# Patient Record
Sex: Male | Born: 1990 | Race: Black or African American | Hispanic: No | Marital: Married | Smoking: Never smoker
Health system: Southern US, Community
[De-identification: ages and names within clinical notes are randomized; demographics above are authoritative.]

## PROBLEM LIST (undated history)

## (undated) DIAGNOSIS — Z6841 Body Mass Index (BMI) 40.0 and over, adult: Secondary | ICD-10-CM

---
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
## 2013-01-11 HISTORY — PX: IRRIGATION AND DEBRIDEMENT ABDOMEN: SHX6600

## 2014-07-09 ENCOUNTER — Other Ambulatory Visit: Payer: Self-pay | Admitting: Orthopedic Surgery

## 2014-07-09 DIAGNOSIS — M542 Cervicalgia: Secondary | ICD-10-CM

## 2014-07-09 DIAGNOSIS — M25511 Pain in right shoulder: Secondary | ICD-10-CM

## 2014-07-25 ENCOUNTER — Ambulatory Visit
Admission: RE | Admit: 2014-07-25 | Discharge: 2014-07-25 | Disposition: A | Discharge status: Home / Self Care | Payer: BLUE CROSS/BLUE SHIELD | Source: Ambulatory Visit | Attending: Orthopedic Surgery | Admitting: Orthopedic Surgery

## 2014-07-25 DIAGNOSIS — M25511 Pain in right shoulder: Secondary | ICD-10-CM

## 2014-07-25 DIAGNOSIS — M542 Cervicalgia: Secondary | ICD-10-CM

## 2014-11-06 ENCOUNTER — Encounter (HOSPITAL_BASED_OUTPATIENT_CLINIC_OR_DEPARTMENT_OTHER): Payer: Self-pay | Admitting: *Deleted

## 2014-11-11 ENCOUNTER — Other Ambulatory Visit: Payer: Self-pay | Admitting: Orthopedic Surgery

## 2014-11-13 ENCOUNTER — Ambulatory Visit (HOSPITAL_BASED_OUTPATIENT_CLINIC_OR_DEPARTMENT_OTHER): Payer: BLUE CROSS/BLUE SHIELD | Admitting: Anesthesiology

## 2014-11-13 ENCOUNTER — Encounter (HOSPITAL_BASED_OUTPATIENT_CLINIC_OR_DEPARTMENT_OTHER): Payer: Self-pay | Admitting: Anesthesiology

## 2014-11-13 ENCOUNTER — Ambulatory Visit (HOSPITAL_BASED_OUTPATIENT_CLINIC_OR_DEPARTMENT_OTHER)
Admission: RE | Admit: 2014-11-13 | Discharge: 2014-11-13 | Disposition: A | Discharge status: Home / Self Care | Payer: BLUE CROSS/BLUE SHIELD | Source: Ambulatory Visit | Attending: Orthopedic Surgery | Admitting: Orthopedic Surgery

## 2014-11-13 ENCOUNTER — Encounter (HOSPITAL_BASED_OUTPATIENT_CLINIC_OR_DEPARTMENT_OTHER)
Admission: RE | Disposition: A | Discharge status: Home / Self Care | Payer: Self-pay | Source: Ambulatory Visit | Attending: Orthopedic Surgery

## 2014-11-13 DIAGNOSIS — S43491A Other sprain of right shoulder joint, initial encounter: Secondary | ICD-10-CM | POA: Insufficient documentation

## 2014-11-13 DIAGNOSIS — M7541 Impingement syndrome of right shoulder: Secondary | ICD-10-CM | POA: Diagnosis not present

## 2014-11-13 DIAGNOSIS — X58XXXA Exposure to other specified factors, initial encounter: Secondary | ICD-10-CM | POA: Insufficient documentation

## 2014-11-13 HISTORY — DX: Body Mass Index (BMI) 40.0 and over, adult: Z684

## 2014-11-13 HISTORY — PX: SHOULDER ARTHROSCOPY WITH OPEN ROTATOR CUFF REPAIR AND DISTAL CLAVICLE ACROMINECTOMY: SHX5683

## 2014-11-13 SURGERY — SHOULDER ARTHROSCOPY WITH OPEN ROTATOR CUFF REPAIR AND DISTAL CLAVICLE ACROMINECTOMY
Anesthesia: Regional | Site: Shoulder | Laterality: Right

## 2014-11-13 MED ORDER — SUCCINYLCHOLINE CHLORIDE 20 MG/ML IJ SOLN
INTRAMUSCULAR | Status: DC | PRN
  Administered 2014-11-13: 140 mg via INTRAVENOUS

## 2014-11-13 MED ORDER — HYDROMORPHONE HCL 1 MG/ML IJ SOLN: 0.2500 mg | INTRAMUSCULAR | Status: DC | PRN

## 2014-11-13 MED ORDER — GLYCOPYRROLATE 0.2 MG/ML IJ SOLN: 0.2000 mg | Freq: Once | INTRAMUSCULAR | Status: DC | PRN

## 2014-11-13 MED ORDER — MEPERIDINE HCL 25 MG/ML IJ SOLN: 6.2500 mg | INTRAMUSCULAR | Status: DC | PRN

## 2014-11-13 MED ORDER — PROPOFOL 10 MG/ML IV BOLUS
INTRAVENOUS | Status: DC | PRN
  Administered 2014-11-13: 200 mg via INTRAVENOUS

## 2014-11-13 MED ORDER — ARTIFICIAL TEARS OP OINT
TOPICAL_OINTMENT | OPHTHALMIC | Status: DC | PRN
  Administered 2014-11-13: 1 via OPHTHALMIC

## 2014-11-13 MED ORDER — FENTANYL CITRATE (PF) 100 MCG/2ML IJ SOLN
INTRAMUSCULAR | Status: AC
  Filled 2014-11-13: qty 4

## 2014-11-13 MED ORDER — MIDAZOLAM HCL 2 MG/2ML IJ SOLN
INTRAMUSCULAR | Status: AC
  Filled 2014-11-13: qty 2

## 2014-11-13 MED ORDER — PROMETHAZINE HCL 25 MG/ML IJ SOLN
6.2500 mg | INTRAMUSCULAR | Status: DC | PRN
Start: 2014-11-13 — End: 2014-11-13

## 2014-11-13 MED ORDER — LACTATED RINGERS IV SOLN: INTRAVENOUS | Status: DC

## 2014-11-13 MED ORDER — DEXAMETHASONE SODIUM PHOSPHATE 4 MG/ML IJ SOLN
INTRAMUSCULAR | Status: DC | PRN
  Administered 2014-11-13: 10 mg via INTRAVENOUS

## 2014-11-13 MED ORDER — CHLORHEXIDINE GLUCONATE 4 % EX LIQD: 60.0000 mL | Freq: Once | CUTANEOUS | Status: DC

## 2014-11-13 MED ORDER — BUPIVACAINE-EPINEPHRINE (PF) 0.5% -1:200000 IJ SOLN
INTRAMUSCULAR | Status: DC | PRN
  Administered 2014-11-13: 20 mL via PERINEURAL

## 2014-11-13 MED ORDER — CEFAZOLIN SODIUM-DEXTROSE 2-3 GM-% IV SOLR
INTRAVENOUS | Status: AC
  Filled 2014-11-13: qty 50

## 2014-11-13 MED ORDER — FENTANYL CITRATE (PF) 100 MCG/2ML IJ SOLN
INTRAMUSCULAR | Status: AC
  Filled 2014-11-13: qty 2

## 2014-11-13 MED ORDER — PROPOFOL 10 MG/ML IV BOLUS
INTRAVENOUS | Status: AC
  Filled 2014-11-13: qty 20

## 2014-11-13 MED ORDER — PHENYLEPHRINE HCL 10 MG/ML IJ SOLN
INTRAMUSCULAR | Status: DC | PRN
  Administered 2014-11-13 (×3): 40 ug via INTRAVENOUS

## 2014-11-13 MED ORDER — LIDOCAINE HCL (CARDIAC) 20 MG/ML IV SOLN
INTRAVENOUS | Status: DC | PRN
  Administered 2014-11-13: 80 mg via INTRAVENOUS

## 2014-11-13 MED ORDER — CEFAZOLIN SODIUM 1-5 GM-% IV SOLN
INTRAVENOUS | Status: AC
  Filled 2014-11-13: qty 50

## 2014-11-13 MED ORDER — OXYCODONE-ACETAMINOPHEN 5-325 MG PO TABS: 1.0000 | ORAL_TABLET | Freq: Four times a day (QID) | ORAL | Status: AC | PRN

## 2014-11-13 MED ORDER — ONDANSETRON HCL 4 MG/2ML IJ SOLN
INTRAMUSCULAR | Status: AC
  Filled 2014-11-13: qty 2

## 2014-11-13 MED ORDER — LIDOCAINE HCL (CARDIAC) 20 MG/ML IV SOLN
INTRAVENOUS | Status: AC
  Filled 2014-11-13: qty 5

## 2014-11-13 MED ORDER — LIDOCAINE HCL 4 % MT SOLN
OROMUCOSAL | Status: DC | PRN
  Administered 2014-11-13: 2 mL via TOPICAL

## 2014-11-13 MED ORDER — PHENYLEPHRINE 40 MCG/ML (10ML) SYRINGE FOR IV PUSH (FOR BLOOD PRESSURE SUPPORT)
PREFILLED_SYRINGE | INTRAVENOUS | Status: AC
  Filled 2014-11-13: qty 10

## 2014-11-13 MED ORDER — MIDAZOLAM HCL 2 MG/2ML IJ SOLN
1.0000 mg | INTRAMUSCULAR | Status: DC | PRN
  Administered 2014-11-13: 2 mg via INTRAVENOUS

## 2014-11-13 MED ORDER — MIDAZOLAM HCL 2 MG/2ML IJ SOLN
INTRAMUSCULAR | Status: AC
  Filled 2014-11-13: qty 4

## 2014-11-13 MED ORDER — DEXAMETHASONE SODIUM PHOSPHATE 10 MG/ML IJ SOLN
INTRAMUSCULAR | Status: AC
  Filled 2014-11-13: qty 1

## 2014-11-13 MED ORDER — FENTANYL CITRATE (PF) 100 MCG/2ML IJ SOLN
50.0000 ug | INTRAMUSCULAR | Status: DC | PRN
  Administered 2014-11-13: 100 ug via INTRAVENOUS
  Administered 2014-11-13: 50 ug via INTRAVENOUS

## 2014-11-13 MED ORDER — SCOPOLAMINE 1 MG/3DAYS TD PT72: 1.0000 | MEDICATED_PATCH | Freq: Once | TRANSDERMAL | Status: DC | PRN

## 2014-11-13 MED ORDER — LACTATED RINGERS IV SOLN
INTRAVENOUS | Status: DC
  Administered 2014-11-13 (×2): via INTRAVENOUS

## 2014-11-13 MED ORDER — CEFAZOLIN SODIUM-DEXTROSE 2-3 GM-% IV SOLR
2.0000 g | INTRAVENOUS | Status: AC
  Administered 2014-11-13: 3 g via INTRAVENOUS

## 2014-11-13 MED ORDER — ONDANSETRON HCL 4 MG/2ML IJ SOLN
INTRAMUSCULAR | Status: DC | PRN
  Administered 2014-11-13: 4 mg via INTRAVENOUS

## 2014-11-13 SURGICAL SUPPLY — 81 items
APL SKNCLS STERI-STRIP NONHPOA (GAUZE/BANDAGES/DRESSINGS)
BENZOIN TINCTURE PRP APPL 2/3 (GAUZE/BANDAGES/DRESSINGS) IMPLANT
BLADE 4.2CUDA (BLADE) ×3 IMPLANT
BLADE SURG 15 STRL LF DISP TIS (BLADE) IMPLANT
BLADE SURG 15 STRL SS (BLADE)
BLADE VORTEX 6.0 (BLADE) ×3 IMPLANT
CANNULA 5.75X71 LONG (CANNULA) IMPLANT
CANNULA TWIST IN 8.25X7CM (CANNULA) IMPLANT
CLOSURE WOUND 1/2 X4 (GAUZE/BANDAGES/DRESSINGS)
DECANTER SPIKE VIAL GLASS SM (MISCELLANEOUS) IMPLANT
DRAPE INCISE IOBAN 66X45 STRL (DRAPES) ×3 IMPLANT
DRAPE STERI 35X30 U-POUCH (DRAPES) ×3 IMPLANT
DRAPE SURG 17X23 STRL (DRAPES) ×3 IMPLANT
DRAPE U-SHAPE 47X51 STRL (DRAPES) ×3 IMPLANT
DRAPE U-SHAPE 76X120 STRL (DRAPES) ×6 IMPLANT
DRSG EMULSION OIL 3X3 NADH (GAUZE/BANDAGES/DRESSINGS) ×3 IMPLANT
DRSG PAD ABDOMINAL 8X10 ST (GAUZE/BANDAGES/DRESSINGS) ×6 IMPLANT
DURAPREP 26ML APPLICATOR (WOUND CARE) ×3 IMPLANT
ELECT REM PT RETURN 9FT ADLT (ELECTROSURGICAL) ×3
ELECTRODE REM PT RTRN 9FT ADLT (ELECTROSURGICAL) ×1 IMPLANT
GAUZE SPONGE 4X4 12PLY STRL (GAUZE/BANDAGES/DRESSINGS) ×3 IMPLANT
GLOVE BIO SURGEON STRL SZ 6.5 (GLOVE) ×1 IMPLANT
GLOVE BIO SURGEON STRL SZ7 (GLOVE) ×2 IMPLANT
GLOVE BIO SURGEONS STRL SZ 6.5 (GLOVE) ×1
GLOVE BIOGEL PI IND STRL 7.0 (GLOVE) IMPLANT
GLOVE BIOGEL PI IND STRL 7.5 (GLOVE) IMPLANT
GLOVE BIOGEL PI IND STRL 8 (GLOVE) ×2 IMPLANT
GLOVE BIOGEL PI INDICATOR 7.0 (GLOVE) ×4
GLOVE BIOGEL PI INDICATOR 7.5 (GLOVE) ×2
GLOVE BIOGEL PI INDICATOR 8 (GLOVE) ×4
GLOVE ECLIPSE 7.5 STRL STRAW (GLOVE) ×6 IMPLANT
GOWN STRL REUS W/ TWL LRG LVL3 (GOWN DISPOSABLE) ×1 IMPLANT
GOWN STRL REUS W/ TWL XL LVL3 (GOWN DISPOSABLE) ×1 IMPLANT
GOWN STRL REUS W/TWL LRG LVL3 (GOWN DISPOSABLE) ×6
GOWN STRL REUS W/TWL XL LVL3 (GOWN DISPOSABLE) ×6 IMPLANT
MANIFOLD NEPTUNE II (INSTRUMENTS) ×3 IMPLANT
NDL 1/2 CIR CATGUT .05X1.09 (NEEDLE) IMPLANT
NDL HYPO 18GX1.5 BLUNT FILL (NEEDLE) ×1 IMPLANT
NDL SCORPION MULTI FIRE (NEEDLE) IMPLANT
NDL SUT 6 .5 CRC .975X.05 MAYO (NEEDLE) IMPLANT
NEEDLE 1/2 CIR CATGUT .05X1.09 (NEEDLE) IMPLANT
NEEDLE HYPO 18GX1.5 BLUNT FILL (NEEDLE) ×3 IMPLANT
NEEDLE MAYO TAPER (NEEDLE)
NEEDLE SCORPION MULTI FIRE (NEEDLE) IMPLANT
NS IRRIG 1000ML POUR BTL (IV SOLUTION) IMPLANT
PACK ARTHROSCOPY DSU (CUSTOM PROCEDURE TRAY) ×3 IMPLANT
PACK BASIN DAY SURGERY FS (CUSTOM PROCEDURE TRAY) ×3 IMPLANT
PASSER SUT SWANSON 36MM LOOP (INSTRUMENTS) IMPLANT
PENCIL BUTTON HOLSTER BLD 10FT (ELECTRODE) IMPLANT
SET IRRIG Y TYPE TUR BLADDER L (SET/KITS/TRAYS/PACK) ×3 IMPLANT
SLEEVE SCD COMPRESS KNEE MED (MISCELLANEOUS) ×3 IMPLANT
SLING ARM IMMOBILIZER LRG (SOFTGOODS) IMPLANT
SLING ARM LRG ADULT FOAM STRAP (SOFTGOODS) IMPLANT
SLING ARM MED ADULT FOAM STRAP (SOFTGOODS) IMPLANT
SLING ARM XL FOAM STRAP (SOFTGOODS) IMPLANT
SPONGE LAP 4X18 X RAY DECT (DISPOSABLE) IMPLANT
STRIP CLOSURE SKIN 1/2X4 (GAUZE/BANDAGES/DRESSINGS) IMPLANT
SUCTION FRAZIER TIP 10 FR DISP (SUCTIONS) IMPLANT
SUT ETHIBOND 2 OS 4 DA (SUTURE) IMPLANT
SUT ETHILON 4 0 PS 2 18 (SUTURE) IMPLANT
SUT FIBERWIRE #2 38 T-5 BLUE (SUTURE)
SUT MNCRL AB 3-0 PS2 18 (SUTURE) IMPLANT
SUT PDS AB 0 CT 36 (SUTURE) IMPLANT
SUT TICRON 1 T 12 (SUTURE) IMPLANT
SUT TIGER TAPE 7 IN WHITE (SUTURE) IMPLANT
SUT VIC AB 0 CT1 27 (SUTURE)
SUT VIC AB 0 CT1 27XBRD ANBCTR (SUTURE) IMPLANT
SUT VIC AB 1 CT1 27 (SUTURE)
SUT VIC AB 1 CT1 27XBRD ANBCTR (SUTURE) IMPLANT
SUT VIC AB 2-0 SH 27 (SUTURE)
SUT VIC AB 2-0 SH 27XBRD (SUTURE) IMPLANT
SUTURE FIBERWR #2 38 T-5 BLUE (SUTURE) IMPLANT
SYR 5ML LL (SYRINGE) ×3 IMPLANT
TAPE FIBER 2MM 7IN #2 BLUE (SUTURE) IMPLANT
TOWEL OR 17X24 6PK STRL BLUE (TOWEL DISPOSABLE) ×3 IMPLANT
TOWEL OR NON WOVEN STRL DISP B (DISPOSABLE) ×3 IMPLANT
TUBE CONNECTING 20'X1/4 (TUBING)
TUBE CONNECTING 20X1/4 (TUBING) IMPLANT
WAND STAR VAC 90 (SURGICAL WAND) ×3 IMPLANT
WATER STERILE IRR 1000ML POUR (IV SOLUTION) ×3 IMPLANT
YANKAUER SUCT BULB TIP NO VENT (SUCTIONS) IMPLANT

## 2014-11-13 NOTE — Anesthesia Preprocedure Evaluation (Addendum)
Anesthesia Evaluation  Patient identified by MRN, date of birth, ID band Patient awake    Reviewed: Allergy & Precautions, NPO status , Patient's Chart, lab work & pertinent test results  Airway Mallampati: III  TM Distance: >3 FB Neck ROM: Full    Dental  (+) Teeth Intact, Dental Advisory Given   Pulmonary neg pulmonary ROS,    breath sounds clear to auscultation       Cardiovascular negative cardio ROS   Rhythm:Regular Rate:Normal     Neuro/Psych negative neurological ROS  negative psych ROS   GI/Hepatic negative GI ROS, Neg liver ROS,   Endo/Other  negative endocrine ROS  Renal/GU negative Renal ROS  negative genitourinary   Musculoskeletal negative musculoskeletal ROS (+)   Abdominal   Peds negative pediatric ROS (+)  Hematology negative hematology ROS (+)   Anesthesia Other Findings   Reproductive/Obstetrics negative OB ROS                            Anesthesia Physical Anesthesia Plan  ASA: III  Anesthesia Plan: General   Post-op Pain Management: GA combined w/ Regional for post-op pain   Induction: Intravenous  Airway Management Planned: Oral ETT  Additional Equipment:   Intra-op Plan:   Post-operative Plan: Extubation in OR  Informed Consent: I have reviewed the patients History and Physical, chart, labs and discussed the procedure including the risks, benefits and alternatives for the proposed anesthesia with the patient or authorized representative who has indicated his/her understanding and acceptance.   Dental advisory given  Plan Discussed with: CRNA  Anesthesia Plan Comments:         Anesthesia Quick Evaluation

## 2014-11-13 NOTE — Brief Op Note (Signed)
11/13/2014  1:01 PM  PATIENT:  Michael [name]  24 y.o. male  PRE-OPERATIVE DIAGNOSIS:  right shoulder impingement, acromioclavicular joint pain  POST-OPERATIVE DIAGNOSIS:  right shoulder impingement, acromioclavicular joint pain  PROCEDURE:  Procedure(s): RIGHT SHOULDER ARTHROSCOPY WITH SUBACROMIAL DECOMPRESSION AND DISTAL CLAVICLE EXCISION, ACROMIOCLAVICULAR JOINT RESECTION (Right)  SURGEON:  Surgeon(s) and Role:    * Jodi GeraldsJohn Murl Zogg, MD - Primary  PHYSICIAN ASSISTANT:   ASSISTANTS: bethune   ANESTHESIA:   general  EBL:  Total I/O In: 1000 [I.V.:1000] Out: -   BLOOD ADMINISTERED:none  DRAINS: none   LOCAL MEDICATIONS USED:  MARCAINE     SPECIMEN:  No Specimen  DISPOSITION OF SPECIMEN:  N/A  COUNTS:  YES  TOURNIQUET:  * No tourniquets in log *  DICTATION: .Other Dictation: Dictation Number [id number]  PLAN OF CARE: Discharge to home after PACU  PATIENT DISPOSITION:  PACU - hemodynamically stable.   Delay start of Pharmacological VTE agent (>24hrs) due to surgical blood loss or risk of bleeding: no

## 2014-11-13 NOTE — Anesthesia Postprocedure Evaluation (Signed)
  Anesthesia Post-op Note  Patient: Ronney AstersJon Luke Gargis  Procedure(s) Performed: Procedure(s): RIGHT SHOULDER ARTHROSCOPY WITH SUBACROMIAL DECOMPRESSION AND DISTAL CLAVICLE EXCISION, ACROMIOCLAVICULAR JOINT RESECTION (Right)  Patient Location: PACU  Anesthesia Type:GA combined with regional for post-op pain  Level of Consciousness: awake, alert  and oriented  Airway and Oxygen Therapy: Patient Spontanous Breathing  Post-op Pain: none  Post-op Assessment: Post-op Vital signs reviewed and Patient's Cardiovascular Status Stable              Post-op Vital Signs: Reviewed and stable  Last Vitals:  Filed Vitals:   11/13/14 1427  BP: 136/78  Pulse: 75  Temp: 36.7 C  Resp: 20    Complications: No apparent anesthesia complications

## 2014-11-13 NOTE — Anesthesia Procedure Notes (Addendum)
Anesthesia Regional Block:  Interscalene brachial plexus block  Pre-Anesthetic Checklist: ,, timeout performed, Correct Patient, Correct Site, Correct Laterality, Correct Procedure, Correct Position, site marked, Risks and benefits discussed,  Surgical consent,  Pre-op evaluation,  At surgeon's request and post-op pain management  Laterality: Right  Prep: chloraprep       Needles:  Injection technique: Single-shot  Needle Type: Stimiplex     Needle Length: 5cm 5 cm Needle Gauge: 22 and 22 G    Additional Needles:  Procedures: ultrasound guided (picture in chart) and nerve stimulator Interscalene brachial plexus block  Nerve Stimulator or Paresthesia:  Response: biceps flexion,   Additional Responses:   Narrative:  Start time: 11/13/2014 11:18 AM End time: 11/13/2014 11:23 AM Injection made incrementally with aspirations every 5 mL.  Performed by: Personally  Anesthesiologist: Shona SimpsonHOLLIS, KEVIN D  Additional Notes: Functioning IV was confirmed and monitors were applied.  A 50mm 22ga Stimuplex needle was used. Sterile prep and drape,hand hygiene and sterile gloves were used.  Negative aspiration and negative test dose prior to incremental administration of local anesthetic. The patient tolerated the procedure well.      Procedure Name: Intubation Date/Time: 11/13/2014 12:05 PM Performed by: Curly ShoresRAFT, Marty Uy W Pre-anesthesia Checklist: Patient identified, Emergency Drugs available, Suction available and Patient being monitored Patient Re-evaluated:Patient Re-evaluated prior to inductionOxygen Delivery Method: Circle System Utilized Preoxygenation: Pre-oxygenation with 100% oxygen Intubation Type: IV induction Ventilation: Mask ventilation without difficulty Grade View: Grade III Tube type: Oral Tube size: 8.0 mm Number of attempts: 1 Airway Equipment and Method: Stylet and Video-laryngoscopy Placement Confirmation: ETT inserted through vocal cords under direct vision,   positive ETCO2 and breath sounds checked- equal and bilateral Secured at: 24 cm Tube secured with: Tape Dental Injury: Teeth and Oropharynx as per pre-operative assessment

## 2014-11-13 NOTE — Progress Notes (Signed)
AssistedDr. Hollis with right, ultrasound guided, interscalene  block. Side rails up, monitors on throughout procedure. See vital signs in flow sheet. Tolerated Procedure well.  

## 2014-11-13 NOTE — Discharge Instructions (Signed)
Discharge Instructions after Arthroscopic Shoulder Surgery ° ° °A sling has been provided for you. You may remove the sling after 72 hours. The sling may be worn for your protection, if you are in a crowd.  °Use ice on the shoulder intermittently over the first 48 hours after surgery.  °Pain medication has been prescribed for you.  °Use your medication liberally over the first 48 hours, and then begin to taper your use. You may take Extra Strength Tylenol or Tylenol only in place of the pain pills. DO NOT take ANY nonsteroidal anti-inflammatory pain medications: Advil, Motrin, Ibuprofen, Aleve, Naproxen, or Naprosyn.  °You may remove your dressing after two days.  °You may shower 5 days after surgery. The incision CANNOT get wet prior to 5 days. Simply allow the water to wash over the site and then pat dry. Do not rub the incision. Make sure your axilla (armpit) is completely dry after showering.  °Take one aspirin a day for 2 weeks after surgery, unless you have an aspirin sensitivity/allergy or asthma.  °Three to 5 times each day you should perform assisted overhead reaching and external rotation (outward turning) exercises with the operative arm. Both exercises should be done with the non-operative arm used as the "therapist arm" while the operative arm remains relaxed. Ten of each exercise should be done three to five times each day. ° ° ° °Overhead reach is helping to lift your stiff arm up as high as it will go. To stretch your overhead reach, lie flat on your back, relax, and grasp the wrist of the tight shoulder with your opposite hand. Using the power in your opposite arm, bring the stiff arm up as far as it is comfortable. Start holding it for ten seconds and then work up to where you can hold it for a count of 30. Breathe slowly and deeply while the arm is moved. Repeat this stretch ten times, trying to help the arm up a little higher each time.  ° ° ° ° ° °External rotation is turning the arm out to  the side while your elbow stays close to your body. External rotation is best stretched while you are lying on your back. Hold a cane, yardstick, broom handle, or dowel in both hands. Bend both elbows to a right angle. Use steady, gentle force from your normal arm to rotate the hand of the stiff shoulder out away from your body. Continue the rotation as far as it will go comfortably, holding it there for a count of 10. Repeat this exercise ten times.  ° ° ° °Please call [phone] during normal business hours or [phone] after hours for any problems. Including the following: ° °- excessive redness of the incisions °- drainage for more than 4 days °- fever of more than 101.5 F ° °*Please note that pain medications will not be refilled after hours or on weekends. ° ° °Regional Anesthesia Blocks ° °1. Numbness or the inability to move the "blocked" extremity may last from 3-48 hours after placement. The length of time depends on the medication injected and your individual response to the medication. If the numbness is not going away after 48 hours, call your surgeon. ° °2. The extremity that is blocked will need to be protected until the numbness is gone and the  Strength has returned. Because you cannot feel it, you will need to take extra care to avoid injury. Because it may be weak, you may have difficulty moving it or using   it. You may not know what position it is in without looking at it while the block is in effect. ° °3. For blocks in the legs and feet, returning to weight bearing and walking needs to be done carefully. You will need to wait until the numbness is entirely gone and the strength has returned. You should be able to move your leg and foot normally before you try and bear weight or walk. You will need someone to be with you when you first try to ensure you do not fall and possibly risk injury. ° °4. Bruising and tenderness at the needle site are common side effects and will resolve in a few  days. ° °5. Persistent numbness or new problems with movement should be communicated to the surgeon or the [facility] ([phone])/ [facility] ([phone]). ° ° °Post Anesthesia Home Care Instructions ° °Activity: °Get plenty of rest for the remainder of the day. A responsible adult should stay with you for 24 hours following the procedure.  °For the next 24 hours, DO NOT: °-Drive a car °-Operate machinery °-Drink alcoholic beverages °-Take any medication unless instructed by your physician °-Make any legal decisions or sign important papers. ° °Meals: °Start with liquid foods such as gelatin or soup. Progress to regular foods as tolerated. Avoid greasy, spicy, heavy foods. If nausea and/or vomiting occur, drink only clear liquids until the nausea and/or vomiting subsides. Call your physician if vomiting continues. ° °Special Instructions/Symptoms: °Your throat may feel dry or sore from the anesthesia or the breathing tube placed in your throat during surgery. If this causes discomfort, gargle with warm salt water. The discomfort should disappear within 24 hours. ° °If you had a scopolamine patch placed behind your ear for the management of post- operative nausea and/or vomiting: ° °1. The medication in the patch is effective for 72 hours, after which it should be removed.  Wrap patch in a tissue and discard in the trash. Wash hands thoroughly with soap and water. °2. You may remove the patch earlier than 72 hours if you experience unpleasant side effects which may include dry mouth, dizziness or visual disturbances. °3. Avoid touching the patch. Wash your hands with soap and water after contact with the patch. °  ° °

## 2014-11-13 NOTE — Transfer of Care (Signed)
Immediate Anesthesia Transfer of Care Note  Patient: Michael [name]  Procedure(s) Performed: Procedure(s): RIGHT SHOULDER ARTHROSCOPY WITH SUBACROMIAL DECOMPRESSION AND DISTAL CLAVICLE EXCISION, ACROMIOCLAVICULAR JOINT RESECTION (Right)  Patient Location: PACU  Anesthesia Type:GA combined with regional for post-op pain  Level of Consciousness: awake, alert  and patient cooperative  Airway & Oxygen Therapy: Patient Spontanous Breathing and Patient connected to face mask oxygen  Post-op Assessment: Report given to RN, Post -op Vital signs reviewed and stable and Patient moving all extremities  Post vital signs: Reviewed and stable  Last Vitals:  Filed Vitals:   11/13/14 1308  BP:   Pulse: 91  Temp:   Resp:     Complications: No apparent anesthesia complications

## 2014-11-13 NOTE — H&P (Signed)
  PREOPERATIVE H&P  Chief Complaint: right shoulder pain  HPI: Michael [name] is a 24 y.o. male who presents for evaluation of right shoulder pain. It has been present for several years and has been worsening. He has failed conservative measures. Pain is rated as severe.  Past Medical History  Diagnosis Date  . BMI 45.0-49.9, adult [facility](HCC)    Past Surgical History  Procedure Laterality Date  . Myringotomy with tube placement  1993  . Wisdom tooth extraction  2007  . Irrigation and debridement abdomen  2015    post brown recluse bite  . Irrigation and debridement abscess  2009    forearm, follwing spider bite   Social History   Social History  . Marital Status: Married    Spouse Name: N/A  . Number of Children: N/A  . Years of Education: N/A   Social History Main Topics  . Smoking status: Never Smoker   . Smokeless tobacco: Never Used  . Alcohol Use: Yes     Comment: occas  . Drug Use: No  . Sexual Activity: Not Asked   Other Topics Concern  . None   Social History Narrative  . None   History reviewed. No pertinent family history. Allergies  Allergen Reactions  . Pertussis Vaccines Anaphylaxis   Prior to Admission medications   Medication Sig Start Date End Date Taking? Authorizing Provider  Multiple Vitamins-Minerals (MULTIVITAMIN WITH MINERALS) tablet Take 1 tablet by mouth daily.   Yes Historical Provider, MD  traMADol (ULTRAM) 50 MG tablet Take 50 mg by mouth every 6 (six) hours as needed for moderate pain.   Yes Historical Provider, MD     Positive ROS: none  All other systems have been reviewed and were otherwise negative with the exception of those mentioned in the HPI and as above.  Physical Exam: There were no vitals filed for this visit.  General: Alert, no acute distress Cardiovascular: No pedal edema Respiratory: No cyanosis, no use of accessory musculature GI: No organomegaly, abdomen is soft and non-tender Skin: No lesions in the area of  chief complaint Neurologic: Sensation intact distally Psychiatric: Patient is competent for consent with normal mood and affect Lymphatic: No axillary or cervical lymphadenopathy  MUSCULOSKELETAL: right shoulder: Tender palpation of the a.c. Joint.  Tender to palpation laterally.  Good external rotation strength.  Dramatic primary and secondary impingement symptoms  MRI: No evidence of rotator cuff tear and significant impingement and a.c. Joint arthritis  Assessment/Plan: right shoulder impingement, acromioclavicular joint pain Plan for Procedure(s): RIGHT SHOULDER ARTHROSCOPY WITH SUBACROMIAL DECOMPRESSION AND DISTAL CLAVICLE EXCISION, ACROMIOCLAVICULAR JOINT RESECTION  The risks benefits and alternatives were discussed with the patient including but not limited to the risks of nonoperative treatment, versus surgical intervention including infection, bleeding, nerve injury, malunion, nonunion, hardware prominence, hardware failure, need for hardware removal, blood clots, cardiopulmonary complications, morbidity, mortality, among others, and they were willing to proceed.  Predicted outcome is good, although there will be at least a six to nine month expected recovery.  Tobey Schmelzle L, MD 11/13/2014 6:47 AM

## 2014-11-14 ENCOUNTER — Encounter (HOSPITAL_BASED_OUTPATIENT_CLINIC_OR_DEPARTMENT_OTHER): Payer: Self-pay | Admitting: Orthopedic Surgery

## 2014-11-14 NOTE — Op Note (Signed)
NAMQuita Skye:  [name], Michael                  ACCOUNT NO.:  [id number]  MEDICAL RECORD NO.:  [id number]  LOCATION:                               FACILITY:  [facility]  PHYSICIAN:  Harvie JuniorJohn L. Manilla Strieter, M.D.   DATE OF BIRTH:  1990/04/12  Pre op Diagnosis 1. 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 Post superior labral tear  Procedure 1. Acromioplasty 2. Distal clavicle resection 2. Debridement of posterior superior labral taer DATE OF PROCEDURE:  11/13/2014 DATE OF DISCHARGE:  11/13/2014                              OPERATIVE REPORT   BRIEF HISTORY:  [name] is a 24 year old male with a long history of having had a work-related injury to his right shoulder, had been treated conservatively for years.  After failure of all conservative care, MRI was obtained, which showed that he had some rotator cuff inflammation and some AC joint narrowing and some impingement.  We had treated him conservatively for a prolonged period of time and after failure of all conservative care, and workup including MRIs of the shoulder and neck, he was taken to the operating room for right shoulder arthroscopy.  DESCRIPTION OF PROCEDURE:  The patient was taken to the operative room and after adequate anesthesia was obtained with general anesthetic, the patient was placed supine on the operating table.  The right shoulder was then prepped and draped in usual sterile fashion.  Following this, routine arthroscopic examination of the shoulder revealed that there was no significant glenohumeral pathology.  Biceps tendon was well anchored. There was a bit of a posterior superior labral tear, which was debrided. The glenohumeral joint was identified, had small degenerative change, but really mild.  Look at the rotator cuff on the undersurface was pristine.  Biceps tendon was well anchored.  Anterior capsule labral complex was intact.  At this point, we turned out the glenohumeral joint.  In the subacromial  space, there was a large subacromial spur, which was debrided with a motorized bur both laterally and anteriorly. We then got to the distal clavicle, we resected 20 mm of distal clavicle through an anterior compartment.  Once this was done, the shoulder was copiously irrigated.  We used a shaver to shave out all the bursal issues, which were in the shoulders at this point and once this was completed, the shoulder was irrigated, suctioned dry, and closed with a bandage.  Sterile pressure dressing was applied.  The patient was taken to the recovery room and was noted to be in a satisfactory condition. Estimated blood loss for the procedure was minimal.     Harvie JuniorJohn L. Anjel Pardo, M.D.     Ranae PlumberJLG/MEDQ  D:  11/13/2014  T:  11/14/2014  Job:  [id number]

## 2016-04-19 IMAGING — MR MR CERVICAL SPINE W/O CM
4 of 5 series · 25 of 48 positions shown · non-contrast
Comparison: None.

CLINICAL DATA: Right shoulder pain. Neck pain. Work injury 2 years
ago.

EXAM:
MRI CERVICAL SPINE WITHOUT CONTRAST
TECHNIQUE: Multiplanar, multisequence MR imaging of the cervical spine was
performed. No intravenous contrast was administered.

[Series 3: T2 · sagittal · 3.3mm · 0.43mm/px · 6 of 12 slices shown (1 of 2)]
[im 1/12]
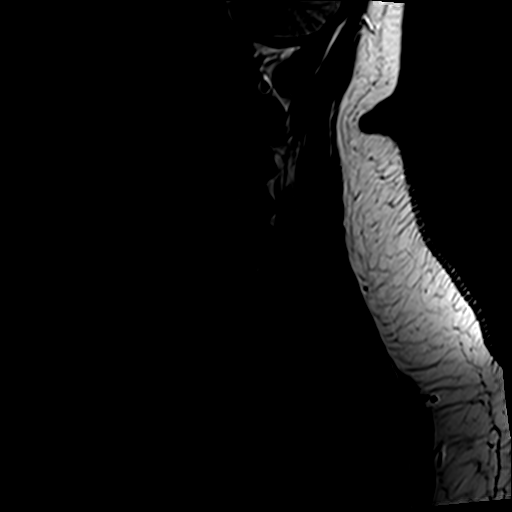
[im 3/12]
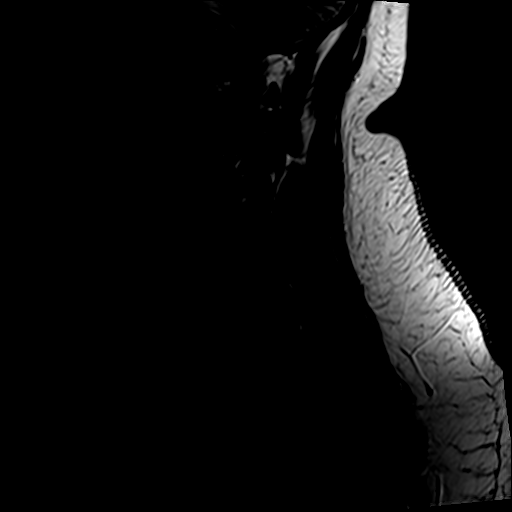
[im 5/12]
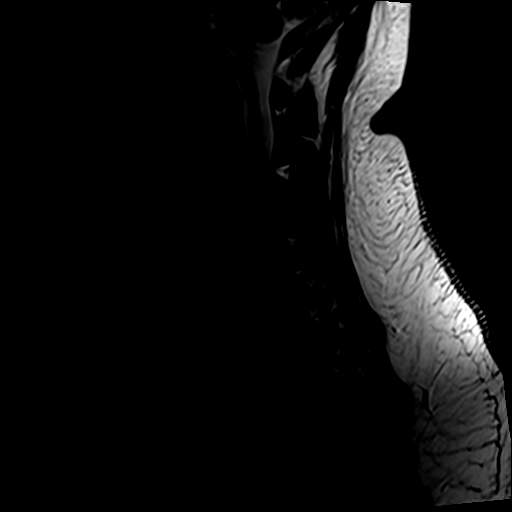
[im 7/12]
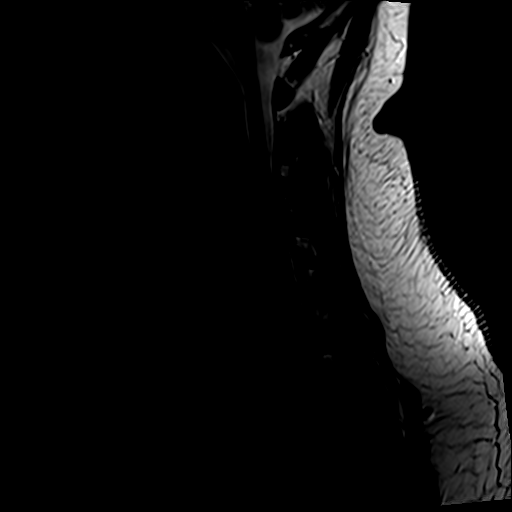
[im 9/12]
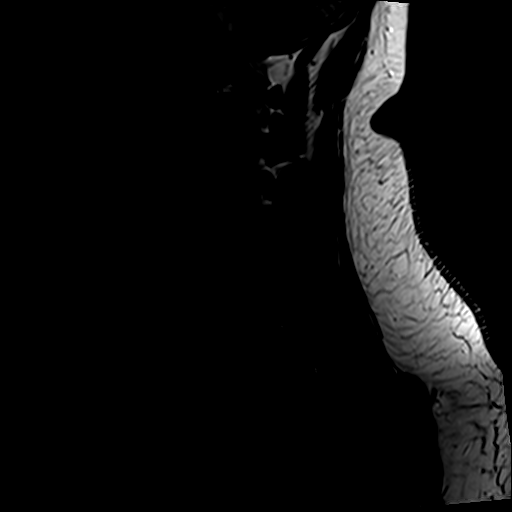
[im 12/12]
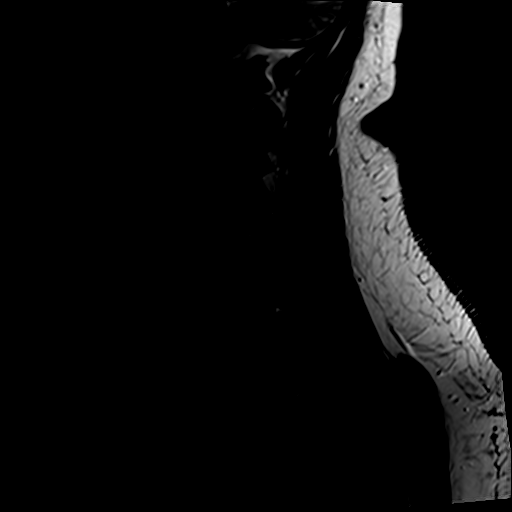

[Series 4: T1 · sagittal · 3.3mm · 0.43mm/px · 7 of 12 slices shown]
[im 1/12]
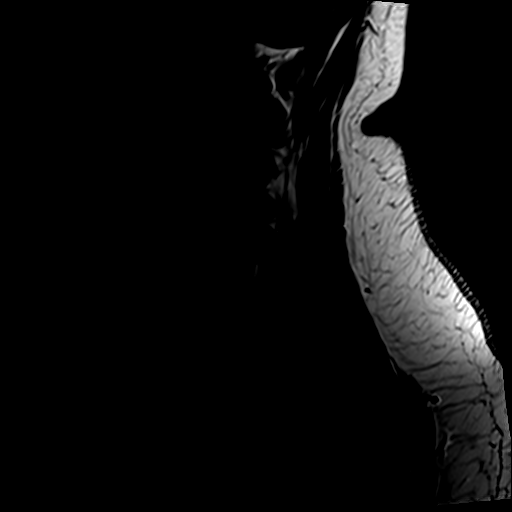
[im 2/12]
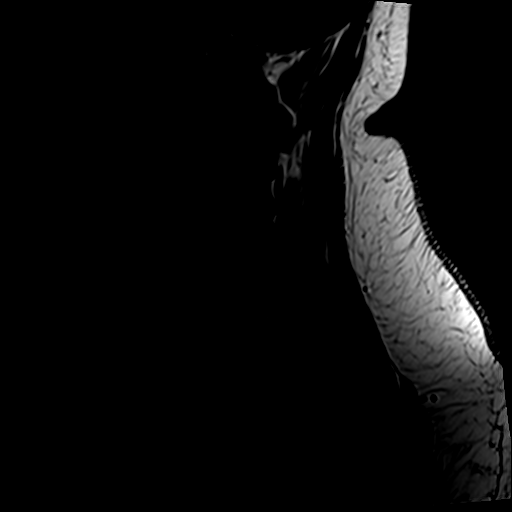
[im 4/12]
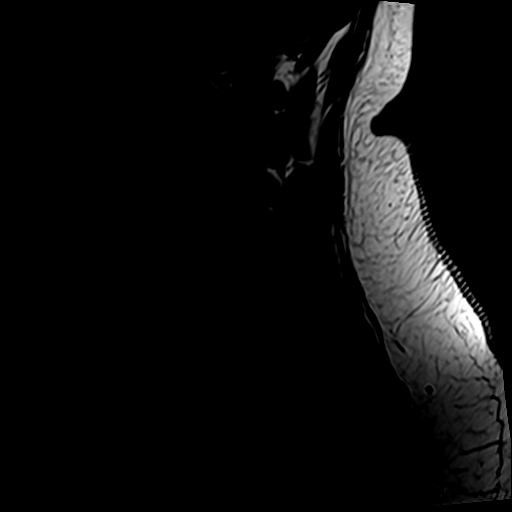
[im 6/12]
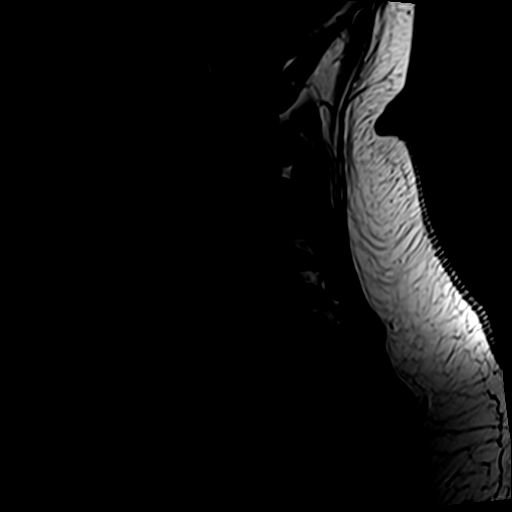
[im 8/12]
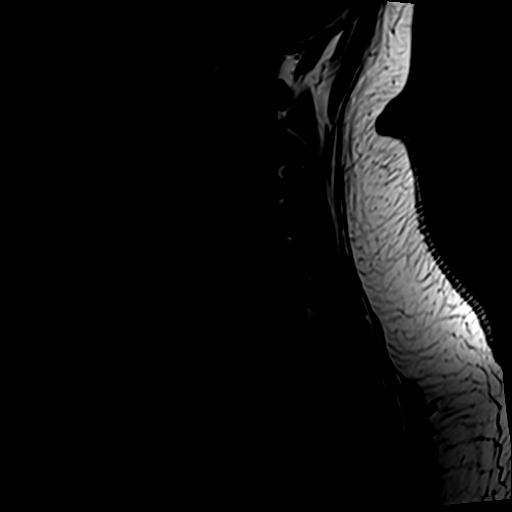
[im 10/12]
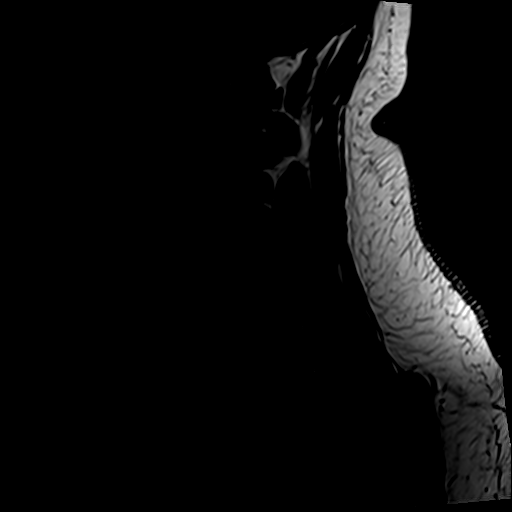
[im 12/12]
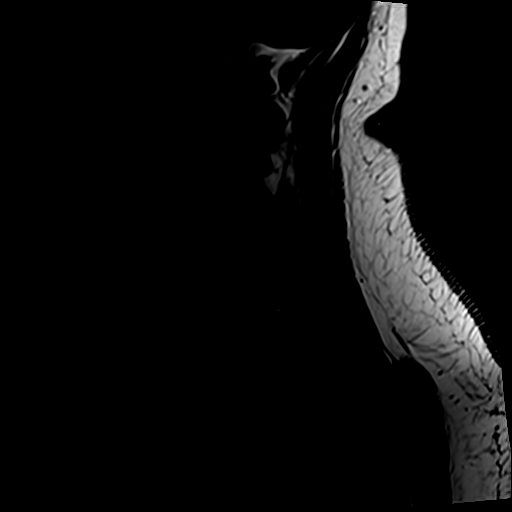

[Series 5: tir sag · sagittal · 3.3mm · 0.43mm/px · 4 of 12 slices shown]
[im 1/12]
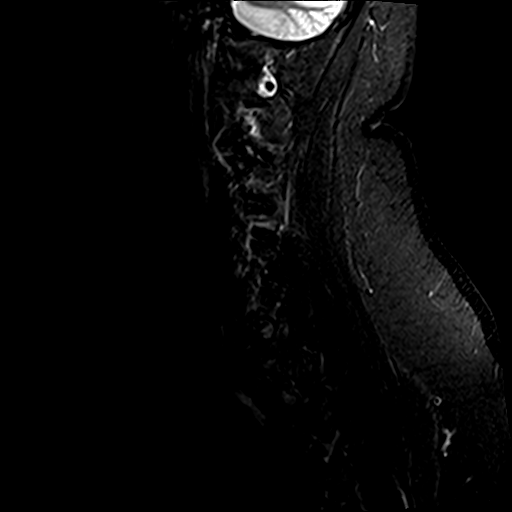
[im 2/12]
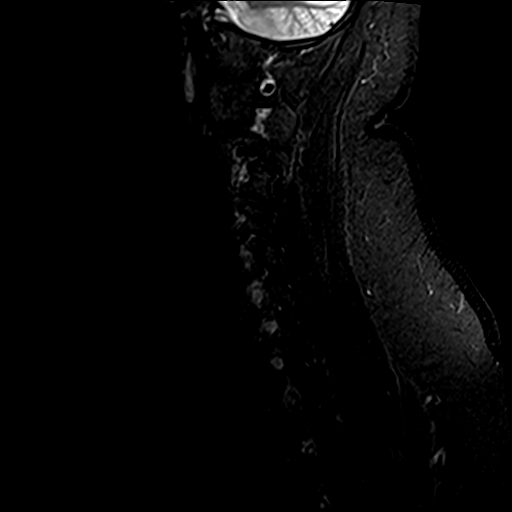
[im 6/12]
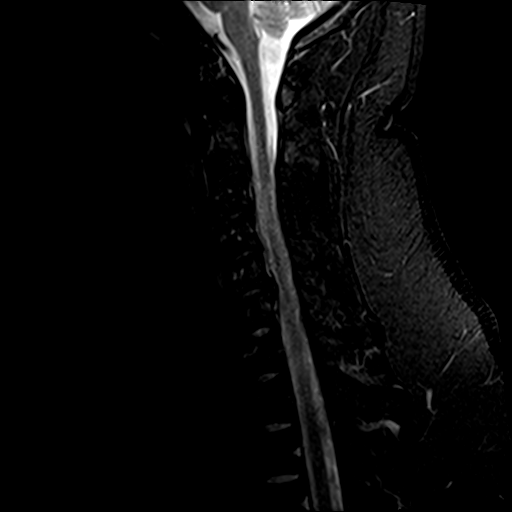
[im 10/12]
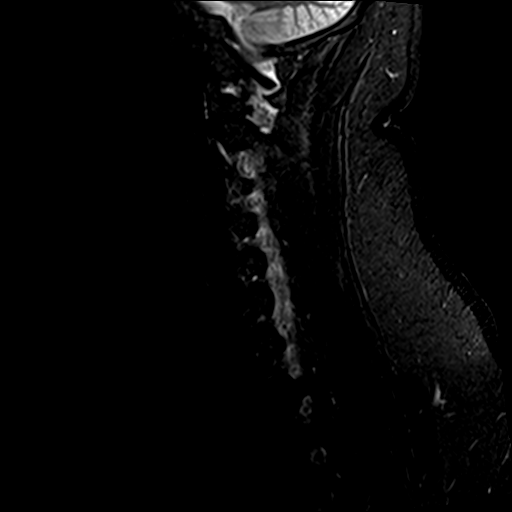

[Series 7: T2 · axial · 3.0mm · 0.74mm/px · z∈[-100,-6]mm · 8 of 26 slices shown (2 of 2)]
[im 1/26]
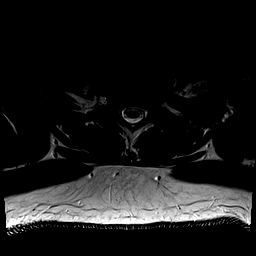
[im 4/26]
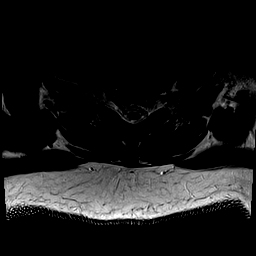
[im 8/26]
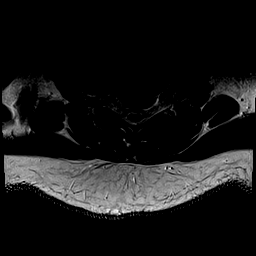
[im 12/26]
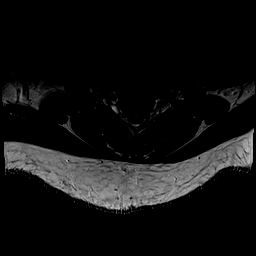
[im 14/26]
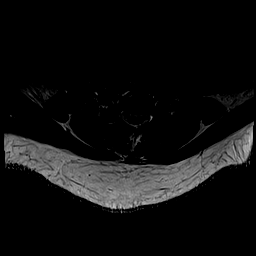
[im 18/26]
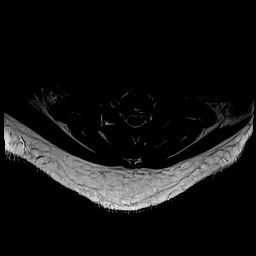
[im 22/26]
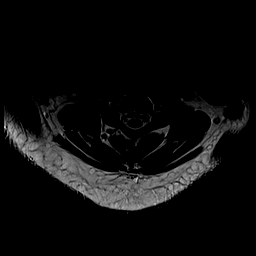
[im 26/26]
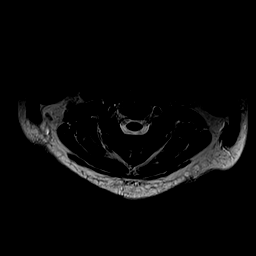

[25 of 48 positions shown; findings below may reference images not displayed]

FINDINGS: Normal signal is present in the cervical and upper thoracic spinal
cord to the lowest imaged level, T2-3. Marrow signal is slightly
depressed. Vertebral body heights and alignment are maintained.
There is straightening of the normal cervical lordosis.
Craniocervical junction is within normal limits. The visualized
intracranial structures are normal.

Flow is present in the vertebral arteries.

C2-3:  Negative.

C3-4:  Negative.

C4-5:  Negative.

C5-6: A rightward disc osteophyte complex results in mild right
central and foraminal narrowing. The left foramen is patent.

C6-7:  Negative.

C7-T1:  Negative.
IMPRESSION: Rightward disc osteophyte complex at C5-6 results an mild right
central and foraminal stenosis.

## 2016-06-06 ENCOUNTER — Encounter (HOSPITAL_COMMUNITY): Payer: Self-pay

## 2016-06-06 ENCOUNTER — Emergency Department (HOSPITAL_COMMUNITY)
Admission: EM | Admit: 2016-06-06 | Discharge: 2016-06-06 | Disposition: A | Discharge status: Home / Self Care | Payer: BLUE CROSS/BLUE SHIELD | Attending: Emergency Medicine | Admitting: Emergency Medicine

## 2016-06-06 DIAGNOSIS — M79605 Pain in left leg: Secondary | ICD-10-CM | POA: Diagnosis present

## 2016-06-06 DIAGNOSIS — M5432 Sciatica, left side: Secondary | ICD-10-CM

## 2016-06-06 MED ORDER — ACETAMINOPHEN 500 MG PO TABS
1000.0000 mg | ORAL_TABLET | Freq: Once | ORAL | Status: AC
  Administered 2016-06-06: 1000 mg via ORAL
  Filled 2016-06-06: qty 2

## 2016-06-06 MED ORDER — OXYCODONE HCL 5 MG PO TABS
5.0000 mg | ORAL_TABLET | Freq: Once | ORAL | Status: AC
  Administered 2016-06-06: 5 mg via ORAL
  Filled 2016-06-06: qty 1

## 2016-06-06 MED ORDER — KETOROLAC TROMETHAMINE 60 MG/2ML IM SOLN
15.0000 mg | Freq: Once | INTRAMUSCULAR | Status: AC
  Administered 2016-06-06: 15 mg via INTRAMUSCULAR
  Filled 2016-06-06: qty 2

## 2016-06-06 MED ORDER — DIAZEPAM 2 MG PO TABS
2.0000 mg | ORAL_TABLET | Freq: Once | ORAL | Status: AC
  Administered 2016-06-06: 2 mg via ORAL
  Filled 2016-06-06: qty 1

## 2016-06-06 NOTE — ED Triage Notes (Signed)
Pt c/o left leg pain since April. Pain radiates up leg to lower back. Worse when sitting.

## 2016-06-06 NOTE — ED Notes (Signed)
Pt c/o 10/10 lower left pain onset last month. Denies injury. States sharp pain starts in his lower back and runs down the back of the leg to the calf. Pt has tried physical therapy to no success, and pt has been taking naproxen for pain.

## 2016-06-06 NOTE — ED Provider Notes (Signed)
[facility]-EMERGENCY DEPT Provider Note   CSN: [id number] Arrival date & time: 06/06/16  0151     History   Chief Complaint Chief Complaint  Patient presents with  . Leg Pain    HPI Michael [name] is a 26 y.o. male.  26 yo M with a chief complaint of left lower back pain. Going on for the past month. Worse with sitting and improved with ambulation. Because of the pain radiates down to his left foot. Has seen his family doctor, an outside ED and physical therapy for this. He feels that is not getting any better. Denies loss of bowel or bladder denies fevers denies loss perirectal sensation. Denies weakness or numbness.   The history is provided by the patient.  Leg Pain   This is a chronic problem. The current episode started more than 1 week ago. The problem occurs constantly. The problem has not changed since onset.The pain is present in the back. The quality of the pain is described as aching and sharp. The pain is at a severity of 8/10. The pain is moderate. He has tried nothing for the symptoms. The treatment provided no relief.    Past Medical History:  Diagnosis Date  . BMI 45.0-49.9, adult (HCC)     There are no active problems to display for this patient.   Past Surgical History:  Procedure Laterality Date  . IRRIGATION AND DEBRIDEMENT ABDOMEN  2015   post brown recluse bite  . IRRIGATION AND DEBRIDEMENT ABSCESS  2009   forearm, follwing spider bite  . MYRINGOTOMY WITH TUBE PLACEMENT  1993  . SHOULDER ARTHROSCOPY WITH OPEN ROTATOR CUFF REPAIR AND DISTAL CLAVICLE ACROMINECTOMY Right 11/13/2014   Procedure: RIGHT SHOULDER ARTHROSCOPY WITH SUBACROMIAL DECOMPRESSION AND DISTAL CLAVICLE EXCISION, ACROMIOCLAVICULAR JOINT RESECTION;  Surgeon: Jodi Geralds, MD;  Location: [facility];  Service: Orthopedics;  Laterality: Right;  . WISDOM TOOTH EXTRACTION  2007       Home Medications    Prior to Admission medications   Medication Sig Start Date End Date  Taking? Authorizing Provider  Multiple Vitamins-Minerals (MULTIVITAMIN WITH MINERALS) tablet Take 1 tablet by mouth daily.    [provider]  oxyCODONE-acetaminophen (PERCOCET/ROXICET) 5-325 MG tablet Take 1-2 tablets by mouth every 6 (six) hours as needed for severe pain. 11/13/14   Marshia Ly, PA-C  traMADol (ULTRAM) 50 MG tablet Take 50 mg by mouth every 6 (six) hours as needed for moderate pain.    [provider]    Family History No family history on file.  Social History Social History  Substance Use Topics  . Smoking status: Never Smoker  . Smokeless tobacco: Never Used  . Alcohol use Yes     Comment: occas     Allergies   Pertussis vaccines   Review of Systems Review of Systems  Constitutional: Negative for chills and fever.  HENT: Negative for congestion and facial swelling.   Eyes: Negative for discharge and visual disturbance.  Respiratory: Negative for shortness of breath.   Cardiovascular: Negative for chest pain and palpitations.  Gastrointestinal: Negative for abdominal pain, diarrhea and vomiting.  Musculoskeletal: Positive for arthralgias, back pain and myalgias.  Skin: Negative for color change and rash.  Neurological: Negative for tremors, syncope and headaches.  Psychiatric/Behavioral: Negative for confusion and dysphoric mood.     Physical Exam Updated Vital Signs BP (!) 141/97 (BP Location: Left Arm)   Pulse 77   Temp 98.3 F (36.8 C) (Oral)   Resp 16  Ht 6\' 5"  (1.956 m)   Wt 134.4 kg (296 lb 6 oz)   SpO2 100%   BMI 35.14 kg/m   Physical Exam  Constitutional: He is oriented to person, place, and time. He appears well-developed and well-nourished.  HENT:  Head: Normocephalic and atraumatic.  Eyes: EOM are normal. Pupils are equal, round, and reactive to light.  Neck: Normal range of motion. Neck supple. No JVD present.  Cardiovascular: Normal rate and regular rhythm.  Exam reveals no gallop and no friction rub.     No murmur heard. Pulmonary/Chest: No respiratory distress. He has no wheezes.  Abdominal: He exhibits no distension. There is no rebound and no guarding.  Musculoskeletal: Normal range of motion. He exhibits tenderness.  Tenderness worse about the left SI joint. Also some mild piriformis tenderness on that side. Pulse motor and sensation is intact distally.  Neurological: He is alert and oriented to person, place, and time.  Skin: No rash noted. No pallor.  Psychiatric: He has a normal mood and affect. His behavior is normal.  Nursing note and vitals reviewed.    ED Treatments / Results  Labs (all labs ordered are listed, but only abnormal results are displayed) Labs Reviewed - No data to display  EKG  EKG Interpretation None       Radiology No results found.  Procedures Procedures (including critical care time)  Medications Ordered in ED Medications  acetaminophen (TYLENOL) tablet 1,000 mg (1,000 mg Oral Given 06/06/16 0900)  diazepam (VALIUM) tablet 2 mg (2 mg Oral Given 06/06/16 0900)  oxyCODONE (Oxy IR/ROXICODONE) immediate release tablet 5 mg (5 mg Oral Given 06/06/16 0900)  ketorolac (TORADOL) injection 15 mg (15 mg Intramuscular Given 06/06/16 0900)     Initial Impression / Assessment and Plan / ED Course  I have reviewed the triage vital signs and the nursing notes.  Pertinent labs & imaging results that were available during my care of the patient were reviewed by me and considered in my medical decision making (see chart for details).     26 yo M  With a chief complaint of sciatica. Going on for the last month. I discussed with the patient that the normal course of low back pain typically lasts for some time. Recommended following up with his family physician. No red flags. Discharge home.  9:23 AM:  I have discussed the diagnosis/risks/treatment options with the patient and believe the pt to be eligible for discharge home to follow-up with PCP. We also  discussed returning to the ED immediately if new or worsening sx occur. We discussed the sx which are most concerning (e.g., sudden worsening pain, fever, inability to tolerate by mouth, cauda equina s/sx) that necessitate immediate return. Medications administered to the patient during their visit and any new prescriptions provided to the patient are listed below.  Medications given during this visit Medications  acetaminophen (TYLENOL) tablet 1,000 mg (1,000 mg Oral Given 06/06/16 0900)  diazepam (VALIUM) tablet 2 mg (2 mg Oral Given 06/06/16 0900)  oxyCODONE (Oxy IR/ROXICODONE) immediate release tablet 5 mg (5 mg Oral Given 06/06/16 0900)  ketorolac (TORADOL) injection 15 mg (15 mg Intramuscular Given 06/06/16 0900)     The patient appears reasonably screen and/or stabilized for discharge and I doubt any other medical condition or other [facility] requiring further screening, evaluation, or treatment in the ED at this time prior to discharge.    Final Clinical Impressions(s) / ED Diagnoses   Final diagnoses:  Sciatica of left side  New Prescriptions New Prescriptions   No medications on file     Melene PlanFloyd, Alyshia Kernan, DO 06/06/16 [id number]

## 2016-06-06 NOTE — Discharge Instructions (Signed)
Take 4 over the counter ibuprofen tablets 3 times a day or 2 over-the-counter naproxen tablets twice a day for pain. Also take tylenol 1000mg(2 extra strength) four times a day.    

## 2017-04-11 ENCOUNTER — Other Ambulatory Visit: Payer: Self-pay

## 2017-04-11 ENCOUNTER — Emergency Department (HOSPITAL_COMMUNITY)
Admission: EM | Admit: 2017-04-11 | Discharge: 2017-04-12 | Disposition: A | Discharge status: Home / Self Care | Payer: BLUE CROSS/BLUE SHIELD | Attending: Emergency Medicine | Admitting: Emergency Medicine

## 2017-04-11 ENCOUNTER — Encounter (HOSPITAL_COMMUNITY): Payer: Self-pay | Admitting: *Deleted

## 2017-04-11 DIAGNOSIS — W57XXXA Bitten or stung by nonvenomous insect and other nonvenomous arthropods, initial encounter: Secondary | ICD-10-CM | POA: Diagnosis not present

## 2017-04-11 DIAGNOSIS — R197 Diarrhea, unspecified: Secondary | ICD-10-CM

## 2017-04-11 DIAGNOSIS — R2242 Localized swelling, mass and lump, left lower limb: Secondary | ICD-10-CM | POA: Diagnosis present

## 2017-04-11 LAB — CBC WITH DIFFERENTIAL/PLATELET
BASOS ABS: 0 10*3/uL (ref 0.0–0.1)
Basophils Relative: 1 %
EOS ABS: 0.2 10*3/uL (ref 0.0–0.7)
EOS PCT: 3 %
HCT: 46 % (ref 39.0–52.0)
Hemoglobin: 15.3 g/dL (ref 13.0–17.0)
LYMPHS PCT: 30 %
Lymphs Abs: 1.8 10*3/uL (ref 0.7–4.0)
MCH: 29.7 pg (ref 26.0–34.0)
MCHC: 33.3 g/dL (ref 30.0–36.0)
MCV: 89.1 fL (ref 78.0–100.0)
Monocytes Absolute: 0.5 10*3/uL (ref 0.1–1.0)
Monocytes Relative: 9 %
Neutro Abs: 3.5 10*3/uL (ref 1.7–7.7)
Neutrophils Relative %: 57 %
PLATELETS: 194 10*3/uL (ref 150–400)
RBC: 5.16 MIL/uL (ref 4.22–5.81)
RDW: 12 % (ref 11.5–15.5)
WBC: 6 10*3/uL (ref 4.0–10.5)

## 2017-04-11 LAB — BASIC METABOLIC PANEL
ANION GAP: 5 (ref 5–15)
BUN: 14 mg/dL (ref 6–20)
CO2: 31 mmol/L (ref 22–32)
Calcium: 9.2 mg/dL (ref 8.9–10.3)
Chloride: 103 mmol/L (ref 101–111)
Creatinine, Ser: 0.84 mg/dL (ref 0.61–1.24)
Glucose, Bld: 102 mg/dL — ABNORMAL HIGH (ref 65–99)
POTASSIUM: 3.6 mmol/L (ref 3.5–5.1)
SODIUM: 139 mmol/L (ref 135–145)

## 2017-04-11 MED ORDER — DIPHENOXYLATE-ATROPINE 2.5-0.025 MG PO TABS
2.0000 | ORAL_TABLET | Freq: Once | ORAL | Status: AC
  Administered 2017-04-11: 2 via ORAL
  Filled 2017-04-11: qty 2

## 2017-04-11 MED ORDER — DIPHENOXYLATE-ATROPINE 2.5-0.025 MG PO TABS: 1.0000 | ORAL_TABLET | Freq: Four times a day (QID) | ORAL | 0 refills | Status: AC | PRN

## 2017-04-11 MED ORDER — SACCHAROMYCES BOULARDII 250 MG PO CAPS: 500.0000 mg | ORAL_CAPSULE | Freq: Two times a day (BID) | ORAL | 0 refills | Status: AC

## 2017-04-11 MED ORDER — SULFAMETHOXAZOLE-TRIMETHOPRIM 800-160 MG PO TABS
1.0000 | ORAL_TABLET | Freq: Once | ORAL | Status: AC
  Administered 2017-04-11: 1 via ORAL
  Filled 2017-04-11: qty 1

## 2017-04-11 MED ORDER — SULFAMETHOXAZOLE-TRIMETHOPRIM 800-160 MG PO TABS: 1.0000 | ORAL_TABLET | Freq: Two times a day (BID) | ORAL | 0 refills | Status: AC

## 2017-04-11 NOTE — ED Triage Notes (Addendum)
Pt c/o red painful spot to left thigh area that started a few days ago, denies any injury, denies any drainage. Pt c/o n/v/d that started 5 days ago with fever and chills.

## 2017-04-11 NOTE — Discharge Instructions (Addendum)
Apply a warm compress soak to your infected skin site 15 minutes several times daily.  Complete the entire course of the antibiotics.  Take the lomotil if your diarrhea persists. You have also been prescribed Florastor which will help prevent the antibiotic from making your diarrhea worse.  Get rechecked for any worsened or persistent symptoms.

## 2017-04-11 NOTE — ED Provider Notes (Signed)
[facility] EMERGENCY DEPARTMENT Provider Note   CSN: [id number] Arrival date & time: 04/11/17  2127     History   Chief Complaint Chief Complaint  Patient presents with  . Leg Pain    HPI Michael [name] is a 27 y.o. male with a prior history of significant sequalae from brown recluse bite to his right forearm presenting with a lesion on his left lateral thigh, present x 6 days.  He describes a small pus filled blister which popped and drained and since the event the center has turned dark now with surrounding erythema.  There has been no further drainage, no development of ulceration or radiating pain and he states it is not the same as prior brown recluse bites. He endorses several episodes of emesis 6 days ago, resolved, but has had subjective fever and nonbloody diarrhea multiple times per day which has not responded to imodium. He reports generalized fatigue.  The history is provided by the patient.    Past Medical History:  Diagnosis Date  . BMI 45.0-49.9, adult (HCC)     There are no active problems to display for this patient.   Past Surgical History:  Procedure Laterality Date  . IRRIGATION AND DEBRIDEMENT ABDOMEN  2015   post brown recluse bite  . IRRIGATION AND DEBRIDEMENT ABSCESS  2009   forearm, follwing spider bite  . MYRINGOTOMY WITH TUBE PLACEMENT  1993  . SHOULDER ARTHROSCOPY WITH OPEN ROTATOR CUFF REPAIR AND DISTAL CLAVICLE ACROMINECTOMY Right 11/13/2014   Procedure: RIGHT SHOULDER ARTHROSCOPY WITH SUBACROMIAL DECOMPRESSION AND DISTAL CLAVICLE EXCISION, ACROMIOCLAVICULAR JOINT RESECTION;  Surgeon: Jodi GeraldsJohn Graves, MD;  Location: [facility];  Service: Orthopedics;  Laterality: Right;  . WISDOM TOOTH EXTRACTION  2007        Home Medications    Prior to Admission medications   Medication Sig Start Date End Date Taking? Authorizing Provider  diphenoxylate-atropine (LOMOTIL) 2.5-0.025 MG tablet Take 1 tablet by mouth 4 (four) times daily as  needed for diarrhea or loose stools. 04/11/17   Burgess AmorIdol, Parks Czajkowski, PA-C  Multiple Vitamins-Minerals (MULTIVITAMIN WITH MINERALS) tablet Take 1 tablet by mouth daily.    [provider]  oxyCODONE-acetaminophen (PERCOCET/ROXICET) 5-325 MG tablet Take 1-2 tablets by mouth every 6 (six) hours as needed for severe pain. 11/13/14   Marshia LyBethune, James, PA-C  saccharomyces boulardii (FLORASTOR) 250 MG capsule Take 2 capsules (500 mg total) by mouth 2 (two) times daily. 04/11/17   Burgess AmorIdol, Tosca Pletz, PA-C  sulfamethoxazole-trimethoprim (BACTRIM DS,SEPTRA DS) 800-160 MG tablet Take 1 tablet by mouth 2 (two) times daily for 10 days. 04/11/17 04/21/17  Burgess AmorIdol, Samayra Hebel, PA-C  traMADol (ULTRAM) 50 MG tablet Take 50 mg by mouth every 6 (six) hours as needed for moderate pain.    [provider]    Family History No family history on file.  Social History Social History   Tobacco Use  . Smoking status: Never Smoker  . Smokeless tobacco: Never Used  Substance Use Topics  . Alcohol use: Yes    Comment: occas  . Drug use: No     Allergies   Pertussis vaccines   Review of Systems Review of Systems  Constitutional: Positive for fatigue and fever.  HENT: Negative for congestion and sore throat.   Eyes: Negative.   Respiratory: Negative for chest tightness and shortness of breath.   Cardiovascular: Negative for chest pain.  Gastrointestinal: Positive for diarrhea, nausea and vomiting. Negative for abdominal pain.  Genitourinary: Negative.   Musculoskeletal: Negative for arthralgias,  joint swelling and neck pain.  Skin: Positive for wound. Negative for rash.  Neurological: Negative for dizziness, weakness, light-headedness, numbness and headaches.  Psychiatric/Behavioral: Negative.      Physical Exam Updated Vital Signs BP 105/69   Pulse 61   Temp 97.9 F (36.6 C) (Oral)   Resp 20   Ht 6\' 5"  (1.956 m)   Wt (!) 154.2 kg (340 lb)   SpO2 100%   BMI 40.32 kg/m   Physical Exam  Constitutional: He  appears well-developed and well-nourished.  HENT:  Head: Normocephalic and atraumatic.  Eyes: Conjunctivae are normal.  Neck: Normal range of motion.  Cardiovascular: Normal rate, regular rhythm, normal heart sounds and intact distal pulses.  Pulmonary/Chest: Effort normal and breath sounds normal. He has no wheezes.  Abdominal: Soft. Bowel sounds are normal. There is no tenderness. There is no guarding.  Musculoskeletal: Normal range of motion.  Neurological: He is alert.  Skin: Skin is warm and dry.  2 cm central eschar surrounding pink erythema left lateral thigh. No drainage, induration or fluctuance.  Psychiatric: He has a normal mood and affect.  Nursing note and vitals reviewed.    ED Treatments / Results  Labs (all labs ordered are listed, but only abnormal results are displayed) Labs Reviewed  BASIC METABOLIC PANEL - Abnormal; Notable for the following components:      Result Value   Glucose, Bld 102 (*)    All other components within normal limits  CBC WITH DIFFERENTIAL/PLATELET    EKG None  Radiology No results found.  Procedures Procedures (including critical care time)  Medications Ordered in ED Medications  diphenoxylate-atropine (LOMOTIL) 2.5-0.025 MG per tablet 2 tablet (2 tablets Oral Given 04/11/17 2330)  sulfamethoxazole-trimethoprim (BACTRIM DS,SEPTRA DS) 800-160 MG per tablet 1 tablet (1 tablet Oral Given 04/11/17 2330)     Initial Impression / Assessment and Plan / ED Course  I have reviewed the triage vital signs and the nursing notes.  Pertinent labs & imaging results that were available during my care of the patient were reviewed by me and considered in my medical decision making (see chart for details).     Labs reviewed and discussed with patient.  He had no diarrhea while here, but was given a dose of lomotil.  Lesion on leg does have appearance of insect bite but with no necrosis suggesting brown recluse. Early mild surrounding cellulitis vs  inflammatory reaction.  He was started on bactrim.  Also prescribed probiotic. Advised close f/u for any worsened sx.  Suspect diarrhea and skin lesion are not related sx.   Discussed with Dr. Fayrene Fearing prior to dc home.     Final Clinical Impressions(s) / ED Diagnoses   Final diagnoses:  Bug bite with infection, initial encounter  Diarrhea, unspecified type    ED Discharge Orders        Ordered    diphenoxylate-atropine (LOMOTIL) 2.5-0.025 MG tablet  4 times daily PRN     04/11/17 2336    sulfamethoxazole-trimethoprim (BACTRIM DS,SEPTRA DS) 800-160 MG tablet  2 times daily     04/11/17 2336    saccharomyces boulardii (FLORASTOR) 250 MG capsule  2 times daily     04/11/17 2336       Burgess Amor, PA-C 04/12/17 Joan Flores    Rolland Porter, MD 04/17/17 [phone]

## 2017-04-11 NOTE — ED Notes (Signed)
Received report on pt, pt updated with plan of care, denies any complaints at present,

## 2017-04-12 NOTE — ED Notes (Signed)
Signature pad would not work for pt or RN in room, pt expressed understanding of instructions and return precautions,
